# Patient Record
Sex: Female | Born: 1990 | Race: White | Hispanic: No | Marital: Single | Smoking: Never smoker
Health system: Southern US, Community
[De-identification: ages and names within clinical notes are randomized; demographics above are authoritative.]

## PROBLEM LIST (undated history)

## (undated) DIAGNOSIS — N83209 Unspecified ovarian cyst, unspecified side: Secondary | ICD-10-CM

## (undated) HISTORY — PX: KNEE SURGERY: SHX244

---

## 2011-01-14 ENCOUNTER — Encounter: Payer: Self-pay | Admitting: *Deleted

## 2011-01-14 ENCOUNTER — Emergency Department (HOSPITAL_COMMUNITY)
Admission: EM | Admit: 2011-01-14 | Discharge: 2011-01-15 | Disposition: A | Discharge status: Home / Self Care | Payer: BC Managed Care – PPO | Attending: Emergency Medicine | Admitting: Emergency Medicine

## 2011-01-14 DIAGNOSIS — R109 Unspecified abdominal pain: Secondary | ICD-10-CM | POA: Insufficient documentation

## 2011-01-14 DIAGNOSIS — N83299 Other ovarian cyst, unspecified side: Secondary | ICD-10-CM

## 2011-01-14 DIAGNOSIS — R10819 Abdominal tenderness, unspecified site: Secondary | ICD-10-CM | POA: Insufficient documentation

## 2011-01-14 DIAGNOSIS — N83209 Unspecified ovarian cyst, unspecified side: Secondary | ICD-10-CM | POA: Insufficient documentation

## 2011-01-14 HISTORY — DX: Unspecified ovarian cyst, unspecified side: N83.209

## 2011-01-14 NOTE — ED Notes (Signed)
Pt in c/o suprapubic abd pain and lower back pain, denies vaginal discharge or urinary symptoms, c/o pain with bowel movement

## 2011-01-15 ENCOUNTER — Emergency Department (HOSPITAL_COMMUNITY): Payer: BC Managed Care – PPO

## 2011-01-15 LAB — GC/CHLAMYDIA PROBE AMP, GENITAL: Chlamydia, DNA Probe: NEGATIVE

## 2011-01-15 LAB — URINALYSIS, ROUTINE W REFLEX MICROSCOPIC
Bilirubin Urine: NEGATIVE
Nitrite: NEGATIVE
Specific Gravity, Urine: 1.026 (ref 1.005–1.030)
Urobilinogen, UA: 0.2 mg/dL (ref 0.0–1.0)

## 2011-01-15 LAB — BASIC METABOLIC PANEL
GFR calc Af Amer: >90 mL/min (ref >90)
GFR calc non Af Amer: >90 mL/min (ref >90)
Glucose, Bld: 92 mg/dL (ref 70–99)
Potassium: 3.6 mEq/L (ref 3.5–5.1)
Sodium: 135 mEq/L (ref 135–145)

## 2011-01-15 LAB — CBC
MCH: 27 pg (ref 26.0–34.0)
Platelets: 288 10*3/uL (ref 150–400)
RBC: 4.66 MIL/uL (ref 3.87–5.11)

## 2011-01-15 LAB — DIFFERENTIAL
Basophils Relative: 0 % (ref 0–1)
Eosinophils Absolute: 0.4 10*3/uL (ref 0.0–0.7)
Lymphs Abs: 3.3 10*3/uL (ref 0.7–4.0)
Neutrophils Relative %: 51 % (ref 43–77)

## 2011-01-15 LAB — WET PREP, GENITAL: Yeast Wet Prep HPF POC: NONE SEEN

## 2011-01-15 MED ORDER — HYDROCODONE-ACETAMINOPHEN 5-325 MG PO TABS: 2.0000 | ORAL_TABLET | ORAL | Status: AC | PRN

## 2011-01-15 NOTE — ED Notes (Signed)
Pt states was concerned with symptoms b/c she had an ovavrian cyst to rupture a year ago and symptoms were similar

## 2011-01-15 NOTE — ED Provider Notes (Signed)
Medical screening examination/treatment/procedure(s) were performed by non-physician practitioner and as supervising physician I was immediately available for consultation/collaboration.   Gwyneth Sprout, MD 01/15/11 [phone]

## 2011-01-15 NOTE — ED Notes (Signed)
Patient transported to Ultrasound 

## 2011-01-15 NOTE — ED Notes (Signed)
Pt given discharge instructions and verbalizes understanding  

## 2011-01-15 NOTE — ED Notes (Signed)
Pt from u/s. Pt cont resting on stretcher. Pt cont to await further dispo. Will cont to monitor

## 2011-01-15 NOTE — ED Notes (Signed)
Pt states

## 2011-01-15 NOTE — ED Provider Notes (Signed)
History     CSN: [id number] Arrival date & time: 01/14/2011 11:06 PM   First MD Initiated Contact with Patient 01/15/11 [phone]      Chief Complaint  Patient presents with  . Abdominal Pain    (Consider location/radiation/quality/duration/timing/severity/associated sxs/prior treatment) HPI Comments: Patient reports that she has had intermittent lower abdominal pain yesterday that began at 1pm yesterday afternoon.  She reports that she has a history of a ruptured ovarian cyst last year and that her symptoms feel similar to when she had that.    Patient is a 20 y.o. female presenting with abdominal pain. The history is provided by the patient.  Abdominal Pain The primary symptoms of the illness include abdominal pain. The primary symptoms of the illness do not include fever, fatigue, shortness of breath, nausea, vomiting, diarrhea, hematemesis, hematochezia, dysuria, vaginal discharge or vaginal bleeding. The onset of the illness was sudden.  The patient states that she believes she is currently not pregnant. The patient has not had a change in bowel habit. Symptoms associated with the illness do not include chills, anorexia, diaphoresis, constipation, urgency, hematuria, frequency or back pain.    Past Medical History  Diagnosis Date  . Ovarian cyst rupture     Past Surgical History  Procedure Date  . Knee surgery     History reviewed. No pertinent family history.  History  Substance Use Topics  . Smoking status: Never Smoker   . Smokeless tobacco: Not on file  . Alcohol Use: No    OB History    Grav Para Term Preterm Abortions TAB SAB Ect Mult Living                  Review of Systems  Constitutional: Negative for fever, chills, diaphoresis and fatigue.  Respiratory: Negative for chest tightness and shortness of breath.   Cardiovascular: Negative for chest pain.  Gastrointestinal: Positive for abdominal pain. Negative for nausea, vomiting, diarrhea, constipation,  hematochezia, abdominal distention, anorexia and hematemesis.  Genitourinary: Negative for dysuria, urgency, frequency, hematuria, flank pain, vaginal bleeding, vaginal discharge and difficulty urinating.  Musculoskeletal: Negative for back pain.  Skin: Negative for color change and rash.  Neurological: Negative for dizziness, syncope, weakness and light-headedness.    Allergies  Penicillins  Home Medications  No current outpatient prescriptions on file.  BP 138/82  Pulse 89  Temp(Src) 98.7 F (37.1 C) (Oral)  Resp 16  SpO2 98%  LMP 12/30/2010  Physical Exam  Constitutional: She is oriented to person, place, and time. She appears well-developed and well-nourished. No distress.  HENT:  Head: Normocephalic and atraumatic.  Neck: Normal range of motion. Neck supple.  Cardiovascular: Normal rate, regular rhythm and normal heart sounds.   Pulmonary/Chest: Effort normal and breath sounds normal. No respiratory distress.  Abdominal: Soft. She exhibits no distension and no mass. There is no hepatosplenomegaly. There is tenderness in the suprapubic area. There is no rigidity, no rebound, no guarding, no CVA tenderness and no tenderness at McBurney's point.  Genitourinary: Vagina normal. There is no rash or lesion on the right labia. There is no rash or lesion on the left labia. Uterus is not tender. Cervix exhibits discharge. Cervix exhibits no motion tenderness. Right adnexum displays tenderness. Right adnexum displays no mass and no fullness. Left adnexum displays no mass, no tenderness and no fullness. No erythema or bleeding around the vagina.  Neurological: She is alert and oriented to person, place, and time.  Skin: Skin is warm and dry. She is  not diaphoretic.  Psychiatric: She has a normal mood and affect.    ED Course  Procedures (including critical care time)  Labs Reviewed  URINALYSIS, ROUTINE W REFLEX MICROSCOPIC - Abnormal; Notable for the following:    Appearance CLOUDY  (*)    All other components within normal limits  DIFFERENTIAL - Abnormal; Notable for the following:    Monocytes Absolute 1.1 (*)    All other components within normal limits  CBC  BASIC METABOLIC PANEL  GC/CHLAMYDIA PROBE AMP, GENITAL  WET PREP, GENITAL   No results found.   1. Physiological ovarian cysts       MDM  Feel that patient's pain may be caused by the ovarian cysts.  Patient has a Theatre manager.  Therefore, feel that patient can be discharged with Gynecology         Pascal Lux [facility] 01/15/11 [phone]

## 2011-03-21 ENCOUNTER — Other Ambulatory Visit: Payer: Self-pay | Admitting: Family Medicine

## 2011-03-21 DIAGNOSIS — R599 Enlarged lymph nodes, unspecified: Secondary | ICD-10-CM

## 2011-03-25 ENCOUNTER — Ambulatory Visit
Admission: RE | Admit: 2011-03-25 | Discharge: 2011-03-25 | Disposition: A | Discharge status: Home / Self Care | Payer: BC Managed Care – PPO | Source: Ambulatory Visit | Attending: Family Medicine | Admitting: Family Medicine

## 2011-03-25 DIAGNOSIS — R599 Enlarged lymph nodes, unspecified: Secondary | ICD-10-CM

## 2013-06-16 ENCOUNTER — Emergency Department (HOSPITAL_COMMUNITY): Payer: 59

## 2013-06-16 ENCOUNTER — Emergency Department (HOSPITAL_COMMUNITY)
Admission: EM | Admit: 2013-06-16 | Discharge: 2013-06-16 | Disposition: A | Discharge status: Home / Self Care | Payer: 59 | Attending: Emergency Medicine | Admitting: Emergency Medicine

## 2013-06-16 ENCOUNTER — Encounter (HOSPITAL_COMMUNITY): Payer: Self-pay | Admitting: Emergency Medicine

## 2013-06-16 DIAGNOSIS — Z880 Allergy status to penicillin: Secondary | ICD-10-CM | POA: Insufficient documentation

## 2013-06-16 DIAGNOSIS — R0789 Other chest pain: Secondary | ICD-10-CM

## 2013-06-16 DIAGNOSIS — Z79899 Other long term (current) drug therapy: Secondary | ICD-10-CM | POA: Insufficient documentation

## 2013-06-16 DIAGNOSIS — Z8742 Personal history of other diseases of the female genital tract: Secondary | ICD-10-CM | POA: Insufficient documentation

## 2013-06-16 LAB — BASIC METABOLIC PANEL
BUN: 8 mg/dL (ref 6–23)
CHLORIDE: 101 meq/L (ref 96–112)
CO2: 23 mEq/L (ref 19–32)
Calcium: 9.5 mg/dL (ref 8.4–10.5)
Creatinine, Ser: 0.89 mg/dL (ref 0.50–1.10)
Glucose, Bld: 87 mg/dL (ref 70–99)
POTASSIUM: 4.1 meq/L (ref 3.7–5.3)
SODIUM: 139 meq/L (ref 137–147)

## 2013-06-16 LAB — CBC
HEMATOCRIT: 41.7 % (ref 36.0–46.0)
Hemoglobin: 14.5 g/dL (ref 12.0–15.0)
MCH: 29.1 pg (ref 26.0–34.0)
MCHC: 34.8 g/dL (ref 30.0–36.0)
MCV: 83.6 fL (ref 78.0–100.0)
PLATELETS: 287 10*3/uL (ref 150–400)
RBC: 4.99 MIL/uL (ref 3.87–5.11)
RDW: 12.7 % (ref 11.5–15.5)
WBC: 9.7 10*3/uL (ref 4.0–10.5)

## 2013-06-16 LAB — I-STAT TROPONIN, ED: TROPONIN I, POC: 0 ng/mL (ref 0.00–0.08)

## 2013-06-16 LAB — D-DIMER, QUANTITATIVE (NOT AT ARMC): D DIMER QUANT: 0.28 ug{FEU}/mL (ref 0.00–0.48)

## 2013-06-16 LAB — PRO B NATRIURETIC PEPTIDE: PRO B NATRI PEPTIDE: 50.9 pg/mL (ref 0–125)

## 2013-06-16 MED ORDER — KETOROLAC TROMETHAMINE 30 MG/ML IJ SOLN
30.0000 mg | Freq: Once | INTRAMUSCULAR | Status: AC
  Administered 2013-06-16: 30 mg via INTRAVENOUS
  Filled 2013-06-16: qty 1

## 2013-06-16 MED ORDER — IBUPROFEN 600 MG PO TABS: 600.0000 mg | ORAL_TABLET | Freq: Four times a day (QID) | ORAL | Status: AC

## 2013-06-16 NOTE — ED Notes (Signed)
Dr. Micheline Mazeocherty was made aware that pts chest is still hurting

## 2013-06-16 NOTE — Discharge Instructions (Signed)

## 2013-06-16 NOTE — ED Notes (Signed)
Pt states she was blowdrying her hair this am and she had a sudden sharp pain from chest into back. States since then she feels like it has been hard to breathe and she continues to have the pain.

## 2013-06-16 NOTE — ED Provider Notes (Signed)
CSN: [id number]     Arrival date & time 06/16/13  1247 History   First MD Initiated Contact with Patient 06/16/13 1351     Chief Complaint  Patient presents with  . Chest Pain     (Consider location/radiation/quality/duration/timing/severity/associated sxs/prior Treatment) Patient is a 23 y.o. female presenting with chest pain. The history is provided by the patient.  Chest Pain Pain location:  Substernal area (thoracic back radiating to chest) Pain quality: sharp   Radiates to: from back to chest. Pain severity:  Moderate Onset quality:  Sudden Duration:  2 hours Timing:  Constant Progression:  Improving Chronicity:  New Context comment:  Blowdrying hair with hands above head Relieved by:  Nothing Worsened by:  Deep breathing, movement and certain positions Ineffective treatments:  None tried Associated symptoms: no abdominal pain, no back pain, no cough, no diaphoresis, no fatigue, no fever, no headache, no lower extremity edema, no nausea, no numbness, no palpitations, no shortness of breath, not vomiting and no weakness   Risk factors: no aortic disease, no birth control, no coronary artery disease, no diabetes mellitus, no Ehlers-Danlos syndrome, no high cholesterol, no hypertension, no immobilization, not female, no Marfan's syndrome, not obese, not pregnant, no prior DVT/PE, no smoking and no surgery     Past Medical History  Diagnosis Date  . Ovarian cyst rupture    Past Surgical History  Procedure Laterality Date  . Knee surgery     History reviewed. No pertinent family history. History  Substance Use Topics  . Smoking status: Never Smoker   . Smokeless tobacco: Not on file  . Alcohol Use: No   OB History   Grav Para Term Preterm Abortions TAB SAB Ect Mult Living                 Review of Systems  Constitutional: Negative for fever, chills, diaphoresis, activity change, appetite change and fatigue.  HENT: Negative for congestion, facial swelling, rhinorrhea  and sore throat.   Eyes: Negative for photophobia and discharge.  Respiratory: Negative for cough, chest tightness and shortness of breath.   Cardiovascular: Positive for chest pain. Negative for palpitations and leg swelling.  Gastrointestinal: Negative for nausea, vomiting, abdominal pain and diarrhea.  Endocrine: Negative for polydipsia and polyuria.  Genitourinary: Negative for dysuria, frequency, difficulty urinating and pelvic pain.  Musculoskeletal: Negative for arthralgias, back pain, neck pain and neck stiffness.  Skin: Negative for color change and wound.  Allergic/Immunologic: Negative for immunocompromised state.  Neurological: Negative for facial asymmetry, weakness, numbness and headaches.  Hematological: Does not bruise/bleed easily.  Psychiatric/Behavioral: Negative for confusion and agitation.      Allergies  Penicillins  Home Medications   Prior to Admission medications   Medication Sig Start Date End Date Taking? Authorizing Provider  fexofenadine (ALLEGRA) 180 MG tablet Take 180 mg by mouth daily.   Yes Historical Provider, MD  ibuprofen (ADVIL,MOTRIN) 600 MG tablet Take 1 tablet (600 mg total) by mouth every 6 (six) hours. 06/16/13   Shanna CiscoMegan E Dimetrius Montfort, MD   BP 111/68  Pulse 67  Temp(Src) 98 F (36.7 C) (Oral)  Resp 19  Ht 5\' 10"  (1.778 m)  Wt 175 lb (79.379 kg)  BMI 25.11 kg/m2  SpO2 100% Physical Exam  Constitutional: She is oriented to person, place, and time. She appears well-developed and well-nourished. No distress.  HENT:  Head: Normocephalic and atraumatic.  Mouth/Throat: No oropharyngeal exudate.  Eyes: Pupils are equal, round, and reactive to light.  Neck: Normal range  of motion. Neck supple.  Cardiovascular: Normal rate, regular rhythm and normal heart sounds.  Exam reveals no gallop and no friction rub.   No murmur heard. Pulmonary/Chest: Effort normal and breath sounds normal. No respiratory distress. She has no wheezes. She has no rales.  She exhibits tenderness.    Abdominal: Soft. Bowel sounds are normal. She exhibits no distension and no mass. There is no tenderness. There is no rebound and no guarding.  Musculoskeletal: Normal range of motion. She exhibits no edema and no tenderness.       Back:  Neurological: She is alert and oriented to person, place, and time.  Skin: Skin is warm and dry.  Psychiatric: She has a normal mood and affect.    ED Course  Procedures (including critical care time) Labs Review Labs Reviewed  BASIC METABOLIC PANEL  PRO B NATRIURETIC PEPTIDE  D-DIMER, QUANTITATIVE  CBC  I-STAT TROPOININ, ED    Imaging Review Dg Chest 2 View  06/16/2013   CLINICAL DATA:  Chest pain radiating to the upper back  EXAM: CHEST  2 VIEW  COMPARISON:  None.  FINDINGS: The heart size and mediastinal contours are within normal limits. Both lungs are clear. The visualized skeletal structures are unremarkable.  IMPRESSION: No active cardiopulmonary disease.   Electronically Signed   By: Elige KoHetal  Patel   On: 06/16/2013 13:41     EKG Interpretation   Date/Time:  Wednesday June 16 2013 12:51:46 EDT Ventricular Rate:  72 PR Interval:  120 QRS Duration: 80 QT Interval:  376 QTC Calculation: 411 R Axis:   77 Text Interpretation:  Normal sinus rhythm Mild j point elevation No prior  for comparison Confirmed by Eveleigh Crumpler  MD, Kimoni Pickerill ([id number]) on 06/16/2013  8:10:58 PM      MDM   Final diagnoses:  Atypical chest pain    Pt is a 23 y.o. female with Pmhx as above who presents with sudden onset sharp mid thoracic pain radiating to chest while drying her hair w/ arms above her head this morning. No assoc SOB, but has inc pain w/ deep breathing. No fever, chills, cough, LE pain or edema. Pain also worse w/ bending/twisting. On PE, VSS< pt in NAD. She has reproducible pain to mid thoracic back and L chest. Cardiopulm and LE exam otherwise benign. No acute ischemic EKG changes. Trop & d-dimer not elevated. Perc negative.  CXR unremarkable. Pt feeling improved after Toradol. Doubt cardiogenic cause of pain & suspect MSK pain. Will d/c home w/ trial of ibuprofen Return precautions given for new or worsening symptoms including worsening pain, SOB, fever.          Shanna CiscoMegan E Danasha Melman, MD 06/16/13 2015

## 2013-12-05 ENCOUNTER — Emergency Department (INDEPENDENT_AMBULATORY_CARE_PROVIDER_SITE_OTHER)
Admission: EM | Admit: 2013-12-05 | Discharge: 2013-12-05 | Disposition: A | Discharge status: Home / Self Care | Payer: 59 | Source: Home / Self Care | Attending: Emergency Medicine | Admitting: Emergency Medicine

## 2013-12-05 ENCOUNTER — Encounter: Payer: Self-pay | Admitting: Emergency Medicine

## 2013-12-05 ENCOUNTER — Emergency Department (INDEPENDENT_AMBULATORY_CARE_PROVIDER_SITE_OTHER): Payer: Commercial Indemnity

## 2013-12-05 DIAGNOSIS — M25561 Pain in right knee: Secondary | ICD-10-CM

## 2013-12-05 DIAGNOSIS — S8391XA Sprain of unspecified site of right knee, initial encounter: Secondary | ICD-10-CM

## 2013-12-05 MED ORDER — HYDROCODONE-ACETAMINOPHEN 5-325 MG PO TABS: 1.0000 | ORAL_TABLET | ORAL | Status: AC | PRN

## 2013-12-05 NOTE — ED Notes (Signed)
No specific known injury.  Patient c/o right knee "did not feel right on Friday."  On Saturday, patient reports a "pop" then pain and swelling.  Taking Ibuprofen yesterday without relief.

## 2013-12-05 NOTE — ED Provider Notes (Signed)
CSN: [id number]     Arrival date & time 12/05/13  1358 History   First MD Initiated Contact with Patient 12/05/13 1444     Chief Complaint  Patient presents with  . Knee Pain   [place] in [place]. She is visiting her grandparents who live in [place] today. Here with her grandparents. HPI No specific known injury. On Friday evening, 2 nights ago, [place] It was dark then and she's not sure if she had any injury, but she noted that right knee "did not feel right" right afterward. Then On Saturday (yesterday), patient reports a "pop" then severe sharp and dull pain and and diffuse right knee swelling. Taking Ibuprofen yesterday without relief.  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 [place] has since moved to [place], going to grad school at [employer].  No chest pain or shortness of breath or calf pain. No GI or GU symptoms. Currently on her menstrual period--denies chance of pregnancy  Past Medical History  Diagnosis Date  . Ovarian cyst rupture    Past Surgical History  Procedure Laterality Date  . Knee surgery     History reviewed. No pertinent family history. History  Substance Use Topics  . Smoking status: Never Smoker   . Smokeless tobacco: Never Used  . Alcohol Use: No   OB History   Grav Para Term Preterm Abortions TAB SAB Ect Mult Living                 Review of Systems  All other systems reviewed and are negative.   Allergies  Penicillins and Shellfish allergy  Home Medications   Prior to Admission medications   Medication Sig Start Date End Date Taking? Authorizing Provider  fexofenadine (ALLEGRA) 180 MG tablet Take 180 mg by mouth daily.    Historical Provider, MD  HYDROcodone-acetaminophen (NORCO/VICODIN) 5-325 MG per tablet Take 1-2 tablets by mouth every 4 (four) hours as needed for severe  pain. Take with food. 12/05/13   Lajean Manesavid Massey, MD  ibuprofen (ADVIL,MOTRIN) 600 MG tablet Take 1 tablet (600 mg total) by mouth every 6 (six) hours. 06/16/13   Toy CookeyMegan Docherty, MD   BP 120/80  Pulse 79  Temp(Src) 97.6 F (36.4 C) (Oral)  Resp 18  Ht 5\' 10"  (1.778 m)  Wt 175 lb (79.379 kg)  BMI 25.11 kg/m2  SpO2 99%  LMP 12/05/2013 Physical Exam  Nursing note and vitals reviewed. Constitutional: She is oriented to person, place, and time. She appears well-developed and well-nourished. No distress.  HENT:  Head: Normocephalic and atraumatic.  Eyes: Conjunctivae and EOM are normal. Pupils are equal, round, and reactive to light. No scleral icterus.  Neck: Normal range of motion.  Cardiovascular: Normal rate.   Pulmonary/Chest: Effort normal.  Abdominal: She exhibits no distension.  Musculoskeletal:       Right knee: She exhibits decreased range of motion, swelling and effusion. She exhibits no laceration. Tenderness found. Medial joint line and lateral joint line tenderness noted.  Right knee 4+ swelling and tenderness Markedly decreased range of motion. Testing for McMurray's sign instability very difficult because of swelling and pain.  Neurological: She is alert and oriented to person, place, and time.  Skin: Skin is warm.  Psychiatric: She has a normal mood and affect.    ED Course  Procedures (including critical care time) Labs Review Labs Reviewed - No data  to display  Imaging Review Dg Knee Complete 4 Views Right  12/05/2013   CLINICAL DATA:  One day history of posterior knee pain  EXAM: RIGHT KNEE - COMPLETE 4+ VIEW  COMPARISON:  None.  FINDINGS: Frontal, lateral, and bilateral oblique views were obtained. There is evidence of prior cruciate ligament repair. No acute fracture or dislocation. No effusion. Joint spaces appear intact. No erosive change.  IMPRESSION: Evidence of prior cruciate ligament repair. No fracture or effusion. No appreciable arthropathy.    Electronically Signed   By: Bretta BangWilliam  Woodruff M.D.   On: 12/05/2013 15:40     MDM   1. Sprain of right knee, initial encounter    Reviewed with patient that x-ray right knee shows no fracture or dislocation or effusion. There is evidence of prior cruciate ligament repair. Explained that I am concerned about possible new ligament tear. Treatment options discussed, as well as risks, benefits, alternatives. Patient voiced understanding and agreement with the following plans: Wrapped with large six-inch Ace bandage right knee. We tried different knee immobilizers, but these were very uncomfortable for her, so she declined any knee immobilizer. Encourage rest, ice, compression with ACE bandage, and elevation of injured body part. Crutches supplied. Vicodin prescribed if needed for severe pain, but precautions discussed. Gave written instructions and advised to [facility]up [facility] in 3-4 days.I  gave her the name and phone number of Dr Despina HickAlusio there, who specializes in knee problems. May use ibuprofen for mild to moderate pain.  Precautions discussed.--Driving precautions discussed Red flags discussed. Questions invited and answered. Patient voiced understanding and agreement.     Lajean Manesavid Massey, MD 12/05/13 [phone]

## 2015-11-15 IMAGING — CR DG CHEST 2V
2 series · 2 of 2 positions shown · non-contrast
Comparison: None.

CLINICAL DATA: Chest pain radiating to the upper back

EXAM:
CHEST  2 VIEW

[w chest pa]
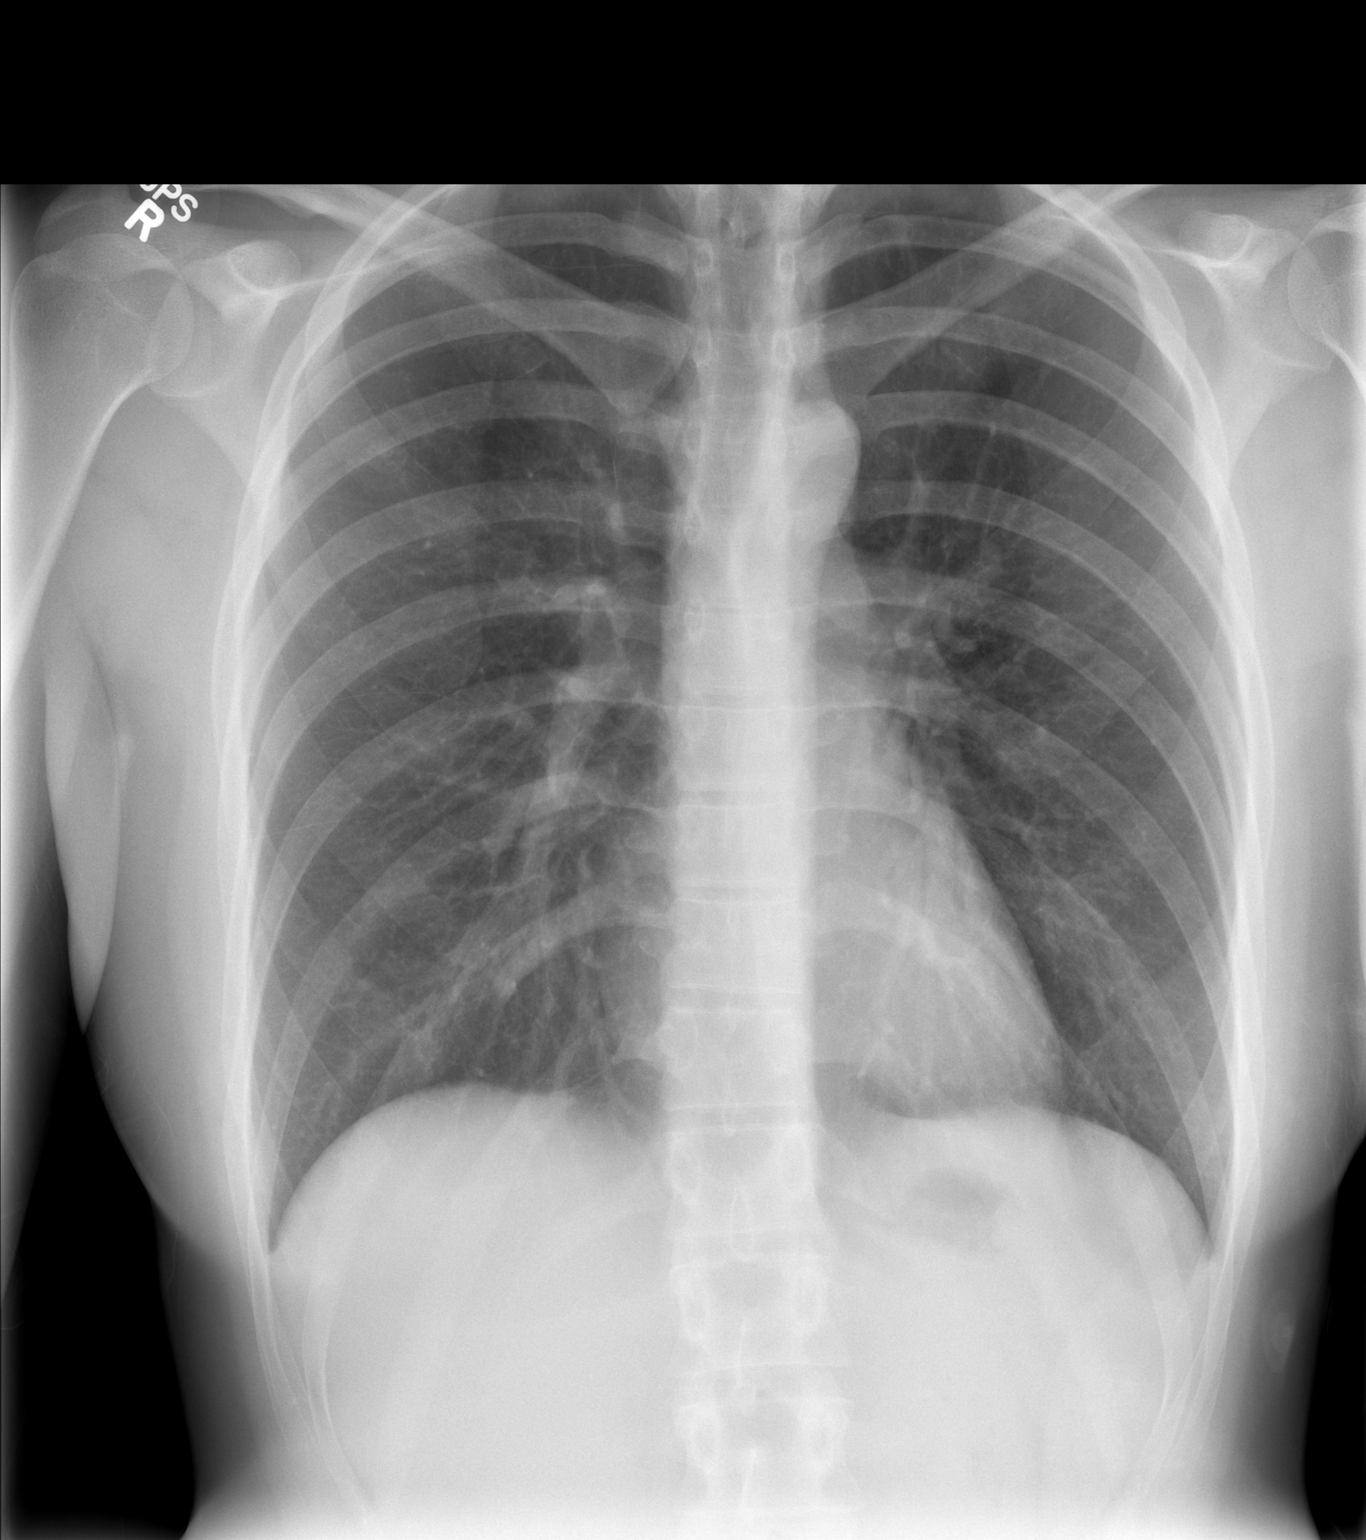

[w chest lat]
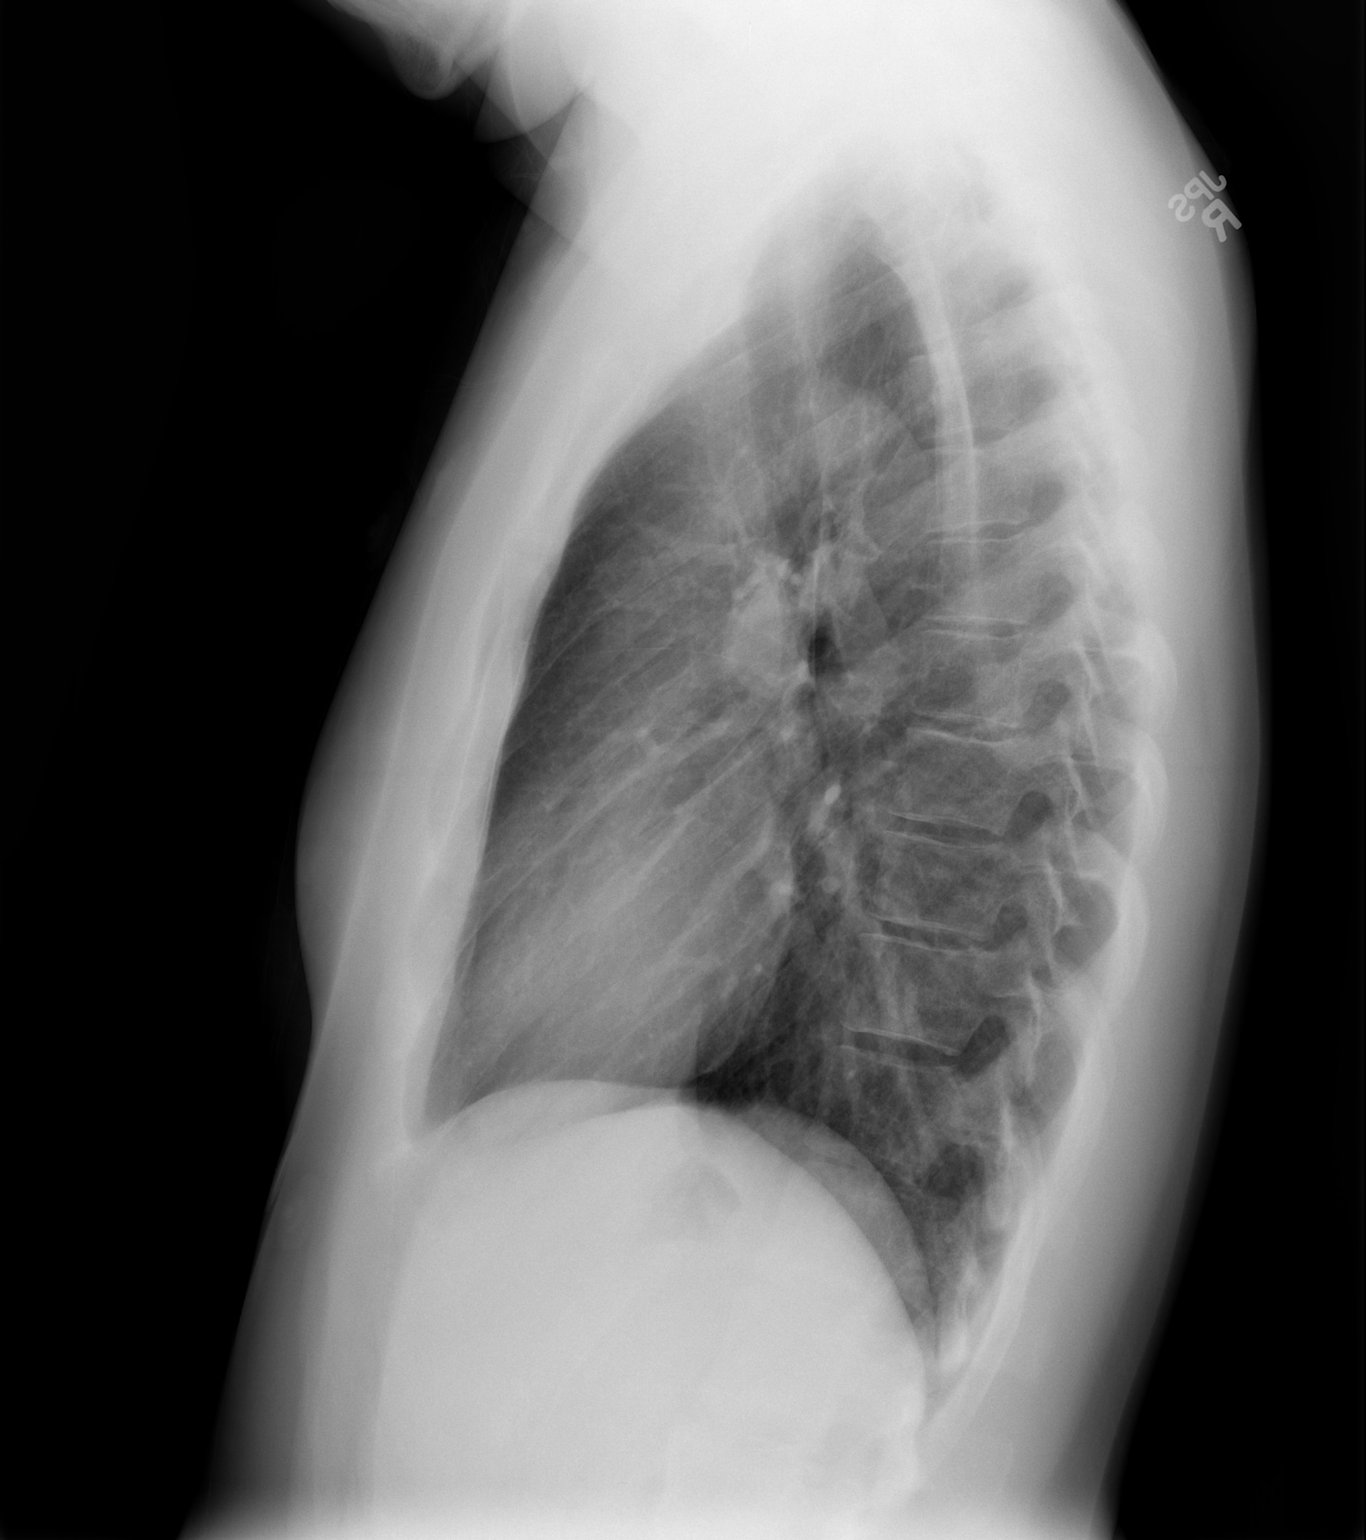

[2 of 2 positions shown; findings below may reference images not displayed]

FINDINGS: The heart size and mediastinal contours are within normal limits.
Both lungs are clear. The visualized skeletal structures are
unremarkable.
IMPRESSION: No active cardiopulmonary disease.
